# Patient Record
Sex: Male | Born: 1995 | Race: White | Hispanic: No | Marital: Single | Smoking: Never smoker
Health system: Southern US, Community
[De-identification: ages and names within clinical notes are randomized; demographics above are authoritative.]

## PROBLEM LIST (undated history)

## (undated) ENCOUNTER — Emergency Department (HOSPITAL_COMMUNITY): Payer: 59

---
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
## 2015-10-11 ENCOUNTER — Emergency Department
Admission: EM | Admit: 2015-10-11 | Discharge: 2015-10-11 | Disposition: A | Discharge status: Home / Self Care | Payer: Worker's Compensation | Attending: Student in an Organized Health Care Education/Training Program | Admitting: Student in an Organized Health Care Education/Training Program

## 2015-10-11 ENCOUNTER — Emergency Department: Payer: Worker's Compensation

## 2015-10-11 ENCOUNTER — Encounter: Payer: Self-pay | Admitting: Emergency Medicine

## 2015-10-11 DIAGNOSIS — W1839XA Other fall on same level, initial encounter: Secondary | ICD-10-CM | POA: Insufficient documentation

## 2015-10-11 DIAGNOSIS — Y999 Unspecified external cause status: Secondary | ICD-10-CM | POA: Insufficient documentation

## 2015-10-11 DIAGNOSIS — Y929 Unspecified place or not applicable: Secondary | ICD-10-CM | POA: Diagnosis not present

## 2015-10-11 DIAGNOSIS — Y939 Activity, unspecified: Secondary | ICD-10-CM | POA: Insufficient documentation

## 2015-10-11 DIAGNOSIS — S93402A Sprain of unspecified ligament of left ankle, initial encounter: Secondary | ICD-10-CM | POA: Insufficient documentation

## 2015-10-11 DIAGNOSIS — Y9361 Activity, american tackle football: Secondary | ICD-10-CM | POA: Diagnosis not present

## 2015-10-11 DIAGNOSIS — M25472 Effusion, left ankle: Secondary | ICD-10-CM

## 2015-10-11 DIAGNOSIS — S99912A Unspecified injury of left ankle, initial encounter: Secondary | ICD-10-CM | POA: Diagnosis present

## 2015-10-11 MED ORDER — IBUPROFEN 600 MG PO TABS
600.0000 mg | ORAL_TABLET | Freq: Once | ORAL | Status: AC
  Administered 2015-10-11: 600 mg via ORAL
  Filled 2015-10-11: qty 1

## 2015-10-11 NOTE — ED Notes (Addendum)
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  Pt in no immediate distress.  Pt wishes to file worker's comp.

## 2015-10-11 NOTE — ED Triage Notes (Signed)
Fell and injured left ankle.  Swelling.  Ice applied

## 2015-10-11 NOTE — ED Provider Notes (Signed)
[facility] Emergency Department Provider Note  ____________________________________________  Time seen: Approximately 11:10 AM  I have reviewed the triage vital signs and the nursing notes.   HISTORY  Chief Complaint Ankle Pain    HPI Jesse Schneider is a 20 y.o. male, NAD, presents to the emergency department for evaluation of left ankle pain and swelling that began today. 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 He jumped up to catch the ball and landed awkwardly on the left foot. Patient immediately had pain in the left ankle and pain with ambulation. He is currently experiencing 6/10 pain and has not taken any medication to alleviate symptoms. He has been icing the affected area and states that it has helped with the pain and swelling. Patient denies numbness/tingling or loss of sensation. Has not noted any redness, bruising, open wounds or lacerations. He denies prior injury to the affected ankle.   History reviewed. No pertinent past medical history.  There are no active problems to display for this patient.   No past surgical history on file.  Prior to Admission medications   Not on File    Allergies Latex  No family history on file.  Social History Social History  Substance Use Topics  . Smoking status: Never Smoker  . Smokeless tobacco: Never Used  . Alcohol use No     Review of Systems  Constitutional:  No fatigue Eyes: no visual changes Cardiovascular: No chest pain, palpitations.  Respiratory: No shortness of breath.  Musculoskeletal: Negative for back pain. Positive left ankle pain with ambulation Skin: Negative for rash, Redness, bruising, open wounds or lacerations. Positive swelling in left ankle Neurological: Negative for headaches, focal weakness or numbness. No tingling, LOC, head injury. 10-point ROS otherwise negative.  ____________________________________________   PHYSICAL  EXAM:  VITAL SIGNS: ED Triage Vitals  Enc Vitals Group     BP 10/11/15 1057 (!) 133/97     Pulse Rate 10/11/15 1057 (!) 56     Resp 10/11/15 1057 18     Temp 10/11/15 1057 98.1 F (36.7 C)     Temp Source 10/11/15 1057 Oral     SpO2 10/11/15 1057 94 %     Weight 10/11/15 1057 159 lb (72.1 kg)     Height 10/11/15 1057 5\' 8"  (1.727 m)     Head Circumference --      Peak Flow --      Pain Score 10/11/15 1053 6     Pain Loc --      Pain Edu? --      Excl. in GC? --      Constitutional: Alert and oriented. Well appearing and in no acute distress. Eyes:  Conjunctiva are normal without icterus or injection. Neck: Supple with full range of motion. Cardiovascular: 2+ pulses noted in the left lower extremity. Capillary refill is brisk in all digits of the left foot. Respiratory: Normal respiratory effort without tachypnea or retractions.  Musculoskeletal:  Tenderness to palpation about the lateral left ankle with no bony abnormality or crepitus noted. Decreased ROM in the left ankle secondary to pain and mild swelling. No laxity with anterior or posterior drawer of the left ankle. No laxity with varus or valgus stress of the left ankle. Patient is able to move all toes without pain Neurologic:  Normal speech and language. No gross focal neurologic deficits are appreciated. Sensation to light touch grossly intact with left lower extremity. Skin:  Skin  is warm, dry and intact. Negative for rash, laceration, ecchymosis, or erythema. Notable swelling about the lateral malleoulus Psychiatric: Mood and affect are normal. Speech and behavior are normal. Patient exhibits appropriate insight and judgement.   ____________________________________________   LABS  None ____________________________________________  EKG  None ____________________________________________  RADIOLOGY I have personally viewed and evaluated these images (plain radiographs) as part of my medical decision making, as  well as reviewing the written report by the radiologist.  Dg Ankle Complete Left  Result Date: 10/11/2015 CLINICAL DATA:  Injury while playing football EXAM: LEFT ANKLE COMPLETE - 3+ VIEW COMPARISON:  None. FINDINGS: Frontal, oblique, and lateral views were obtained. There is soft tissue swelling laterally. There is no demonstrable fracture. There is a small joint effusion. Ankle mortise appears intact. No appreciable joint space narrowing. There is a small bone island in the proximal fifth metatarsal. IMPRESSION: Soft tissue swelling laterally with small joint effusion. Question ligamentous injury. No fracture evident. Ankle mortise appears intact. Electronically Signed   By: Bretta Bang III M.D.   On: 10/11/2015 11:40    ____________________________________________    PROCEDURES  Procedure(s) performed: None   Procedures   Medications  ibuprofen (ADVIL,MOTRIN) tablet 600 mg (600 mg Oral Given 10/11/15 1122)     ____________________________________________   INITIAL IMPRESSION / ASSESSMENT AND PLAN / ED COURSE  Pertinent labs & imaging results that were available during my care of the patient were reviewed by me and considered in my medical decision making (see chart for details).  Clinical Course    Patient's diagnosis is consistent with Left ankle sprain with left ankle effusion. Ace bandage applied to the left ankle and patient given crutches for comfort care. Patient advised not to bear weight on the left ankle until he has been seen in follow-up by orthopedics. Patient will be discharged home with instructions to take over-the-counter Tylenol or ibuprofen as needed for pain. Apply ice to the affected area keep elevated 20 minutes 3-4 times daily as needed. Patient is to follow up with Dr. Lutricia Horsfall in orthopedics for other orthopedic physician as deemed appropriate by the patient's workers compensation insurance carrier in 2-3 days for further evaluation and treatment.  Patient is given ED precautions to return to the ED for any worsening or new symptoms.      ____________________________________________  FINAL CLINICAL IMPRESSION(S) / ED DIAGNOSES  Final diagnoses:  Left ankle sprain, initial encounter  Left ankle effusion      NEW MEDICATIONS STARTED DURING THIS VISIT:  There are no discharge medications for this patient.        Hope Pigeon, PA-C 10/11/15 1220    Willy Eddy, MD 10/11/15 1534

## 2015-10-11 NOTE — ED Notes (Signed)
[facility] profile printed and placed on chart.

## 2015-10-11 NOTE — ED Notes (Signed)
[facility] urine drug screen performed per ED Medic, Mellody Dance.

## 2019-02-18 ENCOUNTER — Other Ambulatory Visit: Payer: Self-pay

## 2019-02-18 DIAGNOSIS — Z20822 Contact with and (suspected) exposure to covid-19: Secondary | ICD-10-CM

## 2019-02-20 LAB — NOVEL CORONAVIRUS, NAA: SARS-CoV-2, NAA: NOT DETECTED

## 2019-05-06 ENCOUNTER — Ambulatory Visit: Payer: Self-pay

## 2019-05-06 ENCOUNTER — Other Ambulatory Visit: Payer: Self-pay

## 2019-05-06 ENCOUNTER — Ambulatory Visit: Payer: Self-pay | Attending: Family

## 2019-05-06 DIAGNOSIS — Z23 Encounter for immunization: Secondary | ICD-10-CM | POA: Insufficient documentation

## 2019-05-06 NOTE — Progress Notes (Signed)
   Covid-19 Vaccination Clinic  Name:  Jesse Schneider    MRN: [id number] DOB: Jul 06, 1995  05/06/2019  [name] was observed post Covid-19 immunization for 15 minutes without incidence. He was provided with Vaccine Information Sheet and instruction to access the V-Safe system.   [name] was instructed to call 911 with any severe reactions post vaccine: Marland Kitchen Difficulty breathing  . Swelling of your face and throat  . A fast heartbeat  . A bad rash all over your body  . Dizziness and weakness    Immunizations Administered    Name Date Dose VIS Date Route   Moderna COVID-19 Vaccine 05/06/2019  2:56 PM 0.5 mL 02/09/2019 Intramuscular   Manufacturer: Moderna   Lot: [id number]   NDC: 01586-825-74

## 2019-06-08 ENCOUNTER — Ambulatory Visit: Payer: Self-pay | Attending: Family

## 2019-06-08 DIAGNOSIS — Z23 Encounter for immunization: Secondary | ICD-10-CM

## 2019-06-08 NOTE — Progress Notes (Signed)
   Covid-19 Vaccination Clinic  Name:  Jesse Schneider    MRN: [id number] DOB: 08/26/1995  06/08/2019  Mr. [name] was observed post Covid-19 immunization for 15 minutes without incident. He was provided with Vaccine Information Sheet and instruction to access the V-Safe system.   Mr. [name] was instructed to call 911 with any severe reactions post vaccine: Marland Kitchen Difficulty breathing  . Swelling of face and throat  . A fast heartbeat  . A bad rash all over body  . Dizziness and weakness   Immunizations Administered    Name Date Dose VIS Date Route   Moderna COVID-19 Vaccine 06/08/2019  3:13 PM 0.5 mL 02/09/2019 Intramuscular   Manufacturer: Moderna   Lot: [id number]   NDC: 49675-916-38
